# Patient Record
Sex: Female | Born: 1960 | Race: White | Hispanic: No
Health system: Southern US, Community
[De-identification: ages and names within clinical notes are randomized; demographics above are authoritative.]

---

## 2004-06-16 ENCOUNTER — Observation Stay (HOSPITAL_COMMUNITY): Admission: RE | Admit: 2004-06-16 | Discharge: 2004-06-17 | Payer: Self-pay | Admitting: Gynecology

## 2004-06-18 ENCOUNTER — Emergency Department: Payer: Self-pay | Admitting: Emergency Medicine

## 2004-06-19 ENCOUNTER — Ambulatory Visit: Payer: Self-pay | Admitting: Emergency Medicine

## 2005-12-25 ENCOUNTER — Emergency Department: Payer: Self-pay | Admitting: Emergency Medicine

## 2006-10-15 ENCOUNTER — Ambulatory Visit: Payer: Self-pay | Admitting: Gastroenterology

## 2006-12-12 ENCOUNTER — Ambulatory Visit: Payer: Self-pay | Admitting: Pain Medicine

## 2006-12-23 ENCOUNTER — Ambulatory Visit: Payer: Self-pay | Admitting: Pain Medicine

## 2007-01-21 ENCOUNTER — Ambulatory Visit: Payer: Self-pay | Admitting: Pain Medicine

## 2007-06-17 ENCOUNTER — Ambulatory Visit: Payer: Self-pay | Admitting: Pain Medicine

## 2007-07-16 ENCOUNTER — Ambulatory Visit: Payer: Self-pay | Admitting: Pain Medicine

## 2007-08-11 ENCOUNTER — Emergency Department: Payer: Self-pay | Admitting: Emergency Medicine

## 2007-08-11 ENCOUNTER — Ambulatory Visit: Payer: Self-pay | Admitting: Internal Medicine

## 2007-11-23 ENCOUNTER — Ambulatory Visit: Payer: Self-pay | Admitting: Family Medicine

## 2009-03-06 ENCOUNTER — Ambulatory Visit: Payer: Self-pay | Admitting: Family Medicine

## 2009-06-10 ENCOUNTER — Emergency Department: Payer: Self-pay | Admitting: Emergency Medicine

## 2010-03-26 ENCOUNTER — Ambulatory Visit: Payer: Self-pay | Admitting: Family Medicine

## 2010-04-09 ENCOUNTER — Emergency Department: Payer: Self-pay | Admitting: Emergency Medicine

## 2010-04-10 ENCOUNTER — Inpatient Hospital Stay: Payer: Self-pay | Admitting: Urology

## 2010-04-13 ENCOUNTER — Ambulatory Visit: Payer: Self-pay | Admitting: Urology

## 2010-04-28 ENCOUNTER — Ambulatory Visit: Payer: Self-pay | Admitting: Urology

## 2011-01-13 ENCOUNTER — Emergency Department: Payer: Self-pay | Admitting: Emergency Medicine

## 2011-07-05 ENCOUNTER — Emergency Department: Payer: Self-pay | Admitting: Emergency Medicine

## 2012-02-20 ENCOUNTER — Ambulatory Visit: Payer: Self-pay | Admitting: Family Medicine

## 2013-06-18 ENCOUNTER — Emergency Department: Payer: Self-pay | Admitting: Emergency Medicine

## 2013-06-18 LAB — BASIC METABOLIC PANEL
Anion Gap: 6 — ABNORMAL LOW (ref 7–16)
BUN: 8 mg/dL (ref 7–18)
Co2: 26 mmol/L (ref 21–32)
EGFR (African American): >60
Osmolality: 272 (ref 275–301)

## 2013-06-18 LAB — CBC
HCT: 37.8 % (ref 35.0–47.0)
HGB: 13.3 g/dL (ref 12.0–16.0)
MCHC: 35.2 g/dL (ref 32.0–36.0)
MCV: 86 fL (ref 80–100)
RDW: 12.2 % (ref 11.5–14.5)
WBC: 7 10*3/uL (ref 3.6–11.0)

## 2013-06-18 LAB — TROPONIN I: Troponin-I: <0.02 ng/mL

## 2015-01-05 ENCOUNTER — Emergency Department
Admit: 2015-01-05 | Disposition: A | Discharge status: Home / Self Care | Payer: Self-pay | Admitting: Emergency Medicine

## 2015-01-05 LAB — COMPREHENSIVE METABOLIC PANEL
ALBUMIN: 4.2 g/dL
Alkaline Phosphatase: 77 U/L
Anion Gap: 9 (ref 7–16)
BUN: 10 mg/dL
Bilirubin,Total: 0.6 mg/dL
CALCIUM: 9.7 mg/dL
CO2: 29 mmol/L
Chloride: 101 mmol/L
Creatinine: 0.64 mg/dL
EGFR (African American): >60
EGFR (Non-African Amer.): >60
Glucose: 88 mg/dL
Potassium: 3.9 mmol/L
SGOT(AST): 23 U/L
SGPT (ALT): 12 U/L — ABNORMAL LOW
Sodium: 139 mmol/L
Total Protein: 8.1 g/dL

## 2015-01-05 LAB — LIPASE, BLOOD: Lipase: 33 U/L

## 2015-01-05 LAB — CBC WITH DIFFERENTIAL/PLATELET
BASOS ABS: 0.1 10*3/uL (ref 0.0–0.1)
Basophil %: 0.9 %
EOS ABS: 0.1 10*3/uL (ref 0.0–0.7)
EOS PCT: 1.9 %
HCT: 39.1 % (ref 35.0–47.0)
HGB: 13 g/dL (ref 12.0–16.0)
Lymphocyte #: 1.6 10*3/uL (ref 1.0–3.6)
Lymphocyte %: 27 %
MCH: 29.7 pg (ref 26.0–34.0)
MCHC: 33.3 g/dL (ref 32.0–36.0)
MCV: 89 fL (ref 80–100)
MONO ABS: 0.3 x10 3/mm (ref 0.2–0.9)
Monocyte %: 5.1 %
Neutrophil #: 4 10*3/uL (ref 1.4–6.5)
Neutrophil %: 65.1 %
Platelet: 238 10*3/uL (ref 150–440)
RBC: 4.38 10*6/uL (ref 3.80–5.20)
RDW: 12.9 % (ref 11.5–14.5)
WBC: 6.1 10*3/uL (ref 3.6–11.0)

## 2015-01-05 LAB — TROPONIN I

## 2015-01-13 ENCOUNTER — Other Ambulatory Visit: Payer: Self-pay

## 2015-01-13 ENCOUNTER — Ambulatory Visit: Admit: 2015-01-13 | Disposition: A | Discharge status: Home / Self Care | Payer: Self-pay | Admitting: Gastroenterology

## 2015-02-08 LAB — SURGICAL PATHOLOGY

## 2019-07-10 ENCOUNTER — Telehealth: Payer: Self-pay

## 2019-07-10 NOTE — Telephone Encounter (Signed)
Pre-visit screening call attempted prior to [facility] appointment on 07/14/2019. No answer / left msg.

## 2019-07-14 ENCOUNTER — Ambulatory Visit: Payer: Self-pay | Attending: Oncology | Admitting: *Deleted

## 2019-07-14 ENCOUNTER — Other Ambulatory Visit: Payer: Self-pay

## 2019-07-14 ENCOUNTER — Encounter: Payer: Self-pay | Admitting: *Deleted

## 2019-07-14 ENCOUNTER — Ambulatory Visit
Admission: RE | Admit: 2019-07-14 | Discharge: 2019-07-14 | Disposition: A | Discharge status: Home / Self Care | Payer: Self-pay | Source: Ambulatory Visit | Attending: Oncology | Admitting: Oncology

## 2019-07-14 VITALS — BP 107/75 | HR 63 | Temp 96.7°F | Ht 61.0 in | Wt 160.4 lb

## 2019-07-14 DIAGNOSIS — Z0000 Encounter for general adult medical examination without abnormal findings: Secondary | ICD-10-CM

## 2019-07-14 NOTE — Progress Notes (Signed)
Risk Assessment    No risk assessment data for the current encounter   Risk Scores      07/13/2019   Last edited by: Orson Slick, CMA   5-year risk: 2.1 %   Lifetime risk: 13.3 %

## 2019-07-14 NOTE — Progress Notes (Signed)
  Subjective:     Patient ID: Nancy Baker, female   DOB: 24-May-1961, 58 y.o.   MRN: [id number]  HPI   Review of Systems     Objective:   Physical Exam Chest:     Breasts:        Right: No swelling, bleeding, inverted nipple, mass, nipple discharge, skin change or tenderness.        Left: No swelling, bleeding, inverted nipple, mass, nipple discharge, skin change or tenderness.    Lymphadenopathy:     Upper Body:     Right upper body: No supraclavicular or axillary adenopathy.     Left upper body: No supraclavicular or axillary adenopathy.        Assessment:     58 year old White female self referral to [facility] for clinical breast exam and mammogram.  Clinical breast exam reveals scarring noted from bilateral breast reduction.  There is no dominant mass, skin changes, nipple discharge or lymphadenopathy.  Family history of her mother with ovarian cancer and cervical cancer at age 48, and breast cancer at age 22.  Mom did not have genetic testing.  Patient states she now has dementia.  Tyrer-Cuzick breast cancer risk assessment with a lifetime risk of13.4%.  Per NCCN guidelines no further imagaing or genetic testing is recommended.  She does meet NCCN guidelines for genetic testing.  Discussed with patient option to refer to medical oncology high risk breast clinic and she states she does not want to have genetic testing.  States she will get her annual mammogram.  Pap smear omitted per protocol.  Patient has a history of hysterectomy for heavy bleeding.  Last pap in 2015 at [facility] was negative.  Patient has been screened for eligibility.  She does not have any insurance, Medicare or Medicaid.  She also meets financial eligibility.  Hand-out given on the Affordable Care Act.    Plan:     Screening mammogram ordered.  Will follow up per [facility] protocol.

## 2019-07-14 NOTE — Patient Instructions (Signed)
Gave patient hand-out, Women Staying Healthy, Active and Well from [facility], with education on breast health, pap smears, heart and colon health. 

## 2019-07-16 ENCOUNTER — Encounter: Payer: Self-pay | Admitting: *Deleted

## 2019-07-22 ENCOUNTER — Encounter: Payer: Self-pay | Admitting: *Deleted

## 2021-05-23 ENCOUNTER — Other Ambulatory Visit: Payer: Self-pay | Admitting: Family Medicine

## 2021-05-23 ENCOUNTER — Ambulatory Visit
Admission: RE | Admit: 2021-05-23 | Discharge: 2021-05-23 | Disposition: A | Discharge status: Home / Self Care | Payer: Self-pay | Source: Ambulatory Visit | Attending: Family Medicine | Admitting: Family Medicine

## 2021-05-23 ENCOUNTER — Ambulatory Visit
Admission: RE | Admit: 2021-05-23 | Discharge: 2021-05-23 | Disposition: A | Discharge status: Home / Self Care | Payer: Self-pay | Attending: Family Medicine | Admitting: Family Medicine

## 2021-05-23 ENCOUNTER — Other Ambulatory Visit: Payer: Self-pay

## 2021-05-23 DIAGNOSIS — R52 Pain, unspecified: Secondary | ICD-10-CM

## 2022-06-11 IMAGING — CR DG HAND COMPLETE 3+V*L*
3 series · 3 of 3 positions shown · non-contrast
Comparison: None.

CLINICAL DATA: Fall with pain. Left hand and wrist pain after fall
over the weekend.

EXAM:
LEFT HAND - COMPLETE 3+ VIEW

[hand ap]
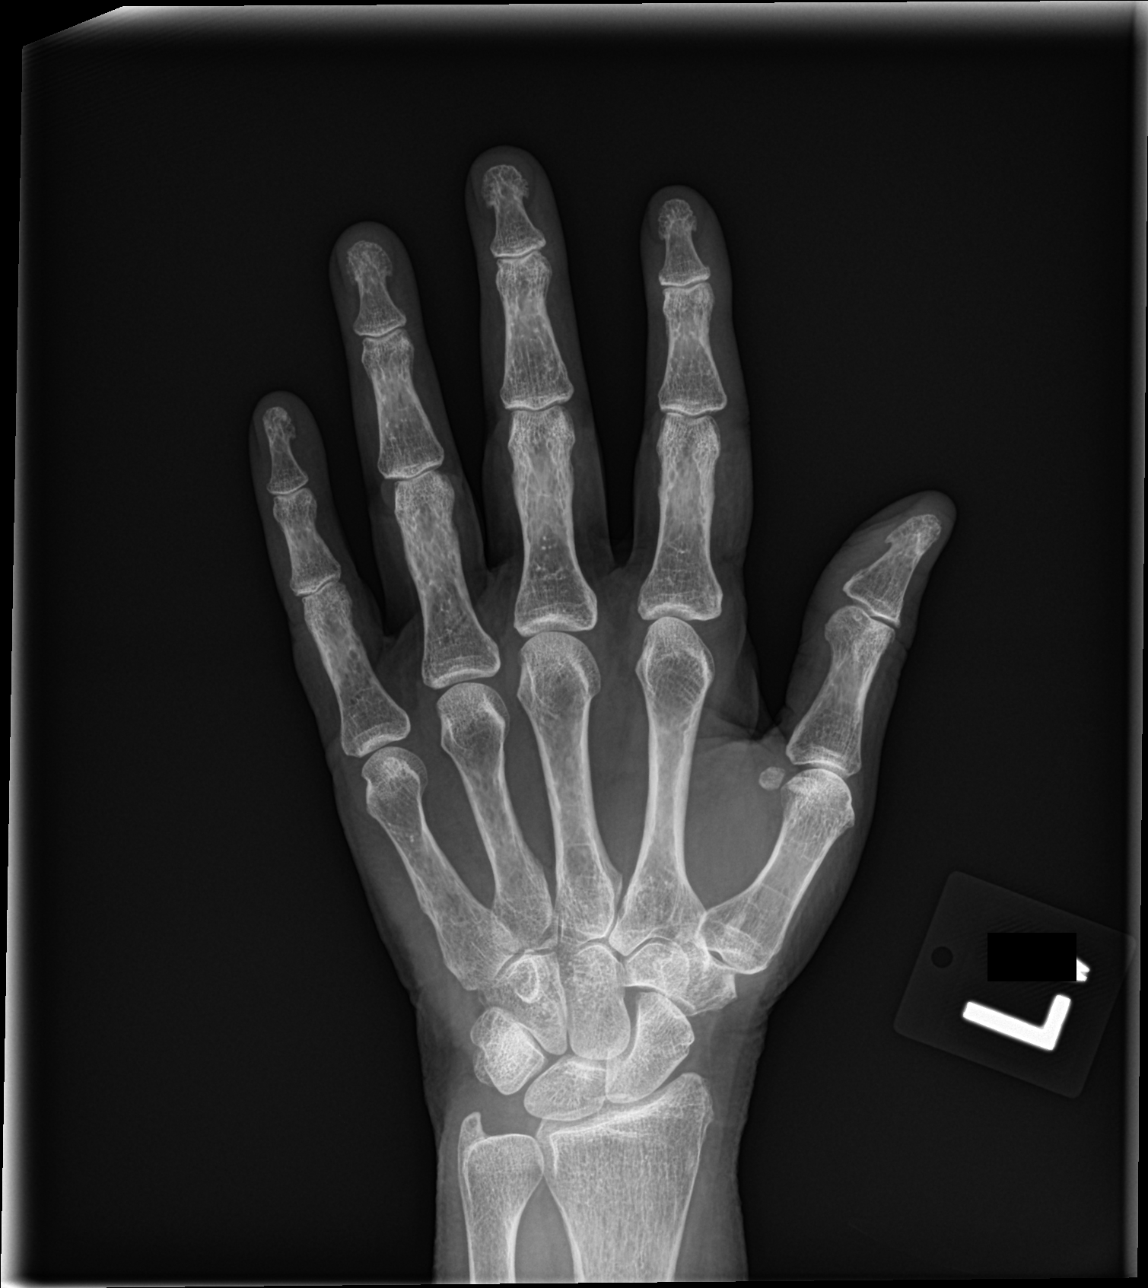

[hand obl]
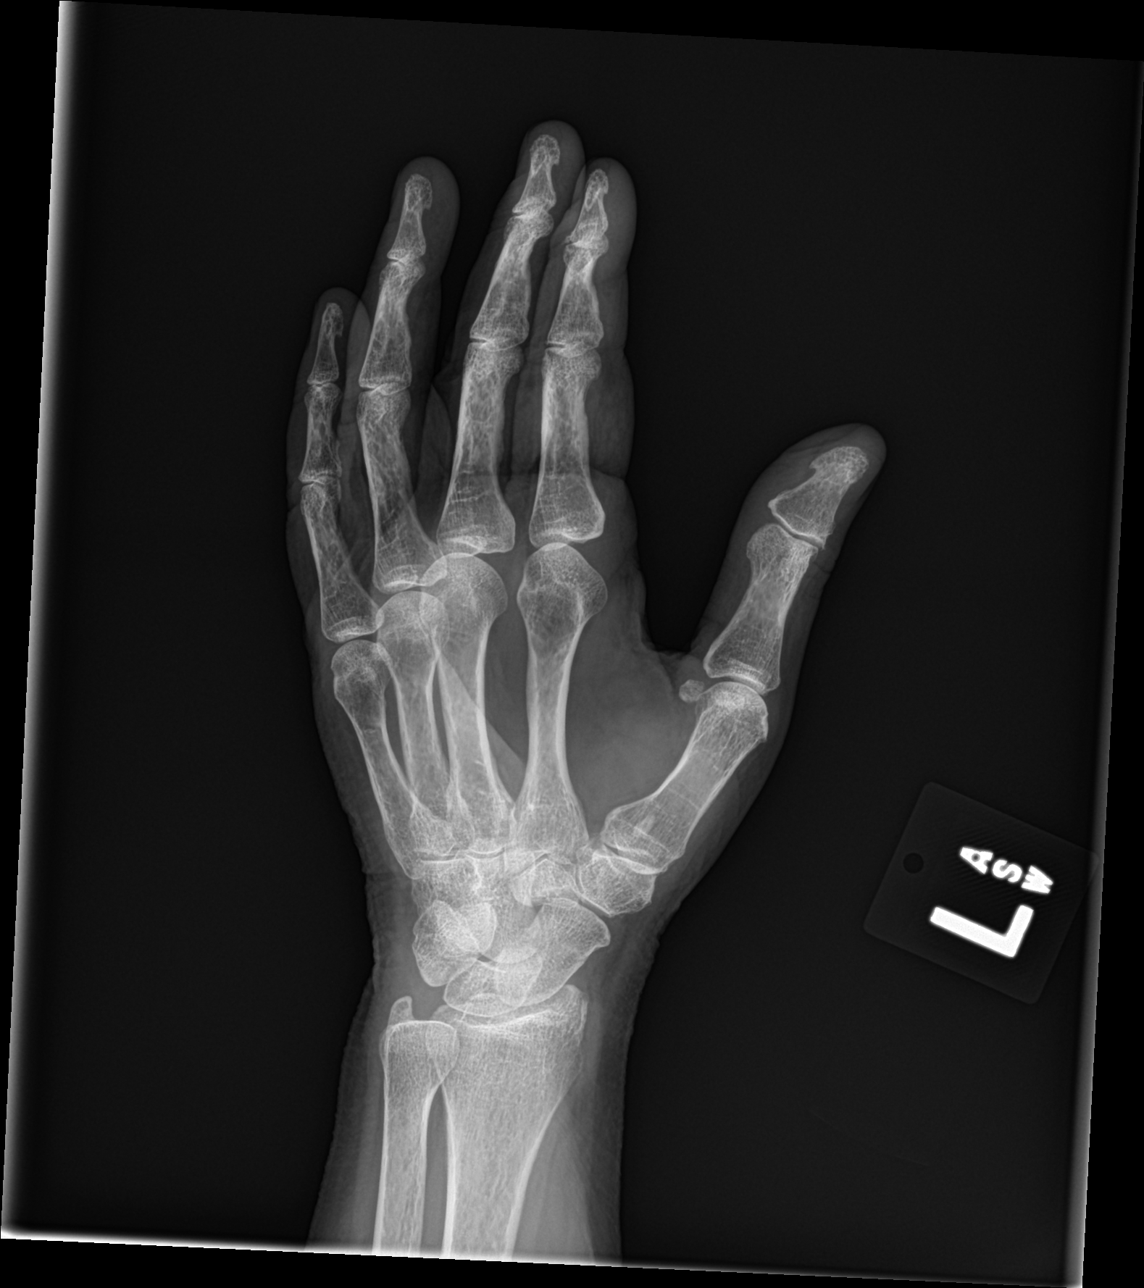

[hand lat]
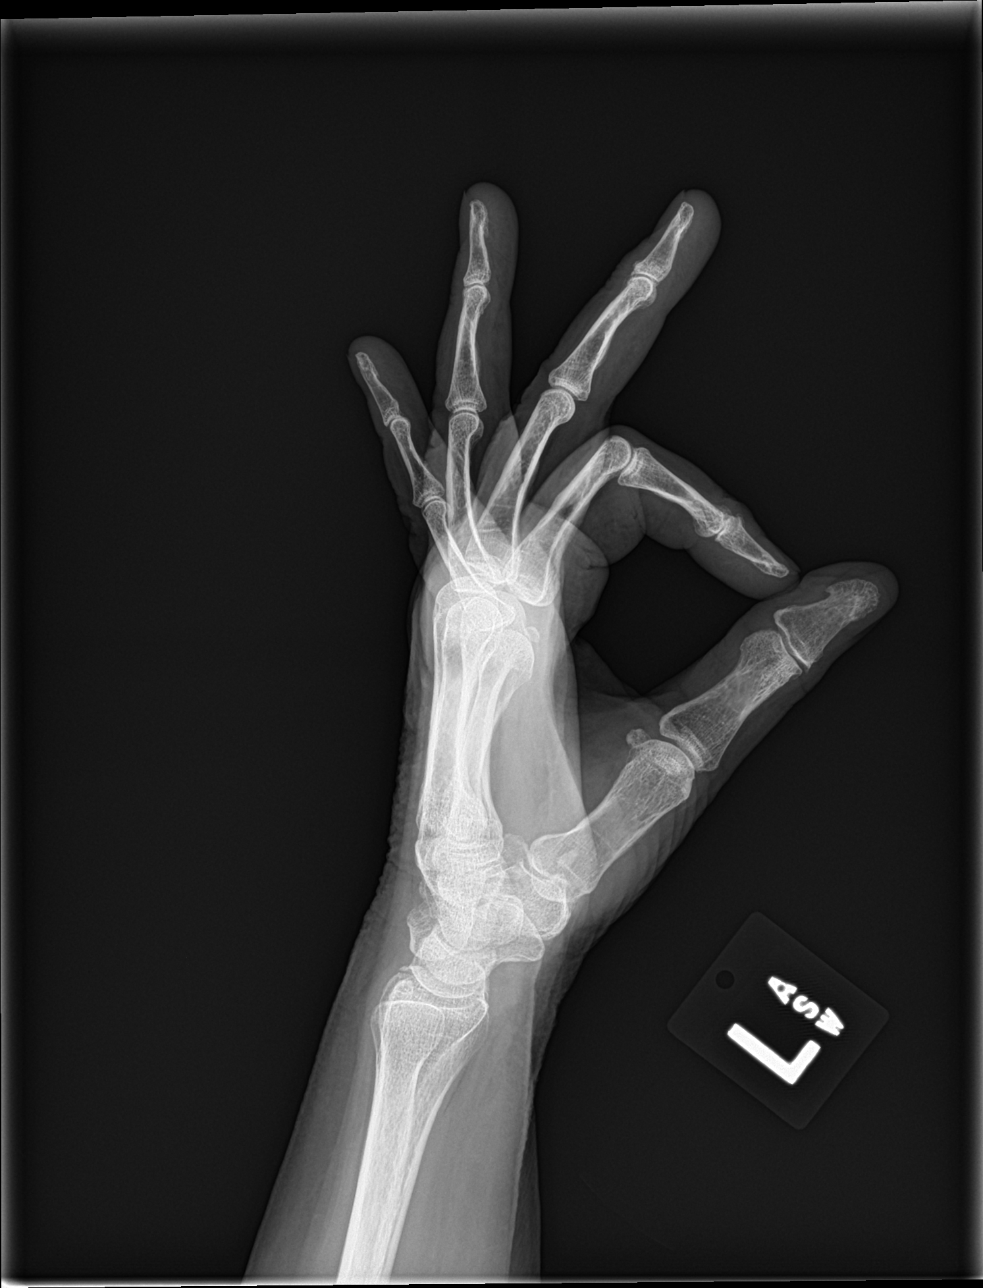

[3 of 3 positions shown; findings below may reference images not displayed]

FINDINGS: There is no evidence of fracture or dislocation. There is no
evidence of arthropathy or other focal bone abnormality. Soft
tissues are unremarkable.
IMPRESSION: No fracture or dislocation of the left hand.

## 2022-06-11 IMAGING — CR DG WRIST COMPLETE 3+V*L*
4 series · 4 of 4 positions shown · non-contrast
Comparison: None.

CLINICAL DATA: Pain after fall. Left hand and wrist pain after fall
over the weekend.

EXAM:
LEFT WRIST - COMPLETE 3+ VIEW

[wrist pa]
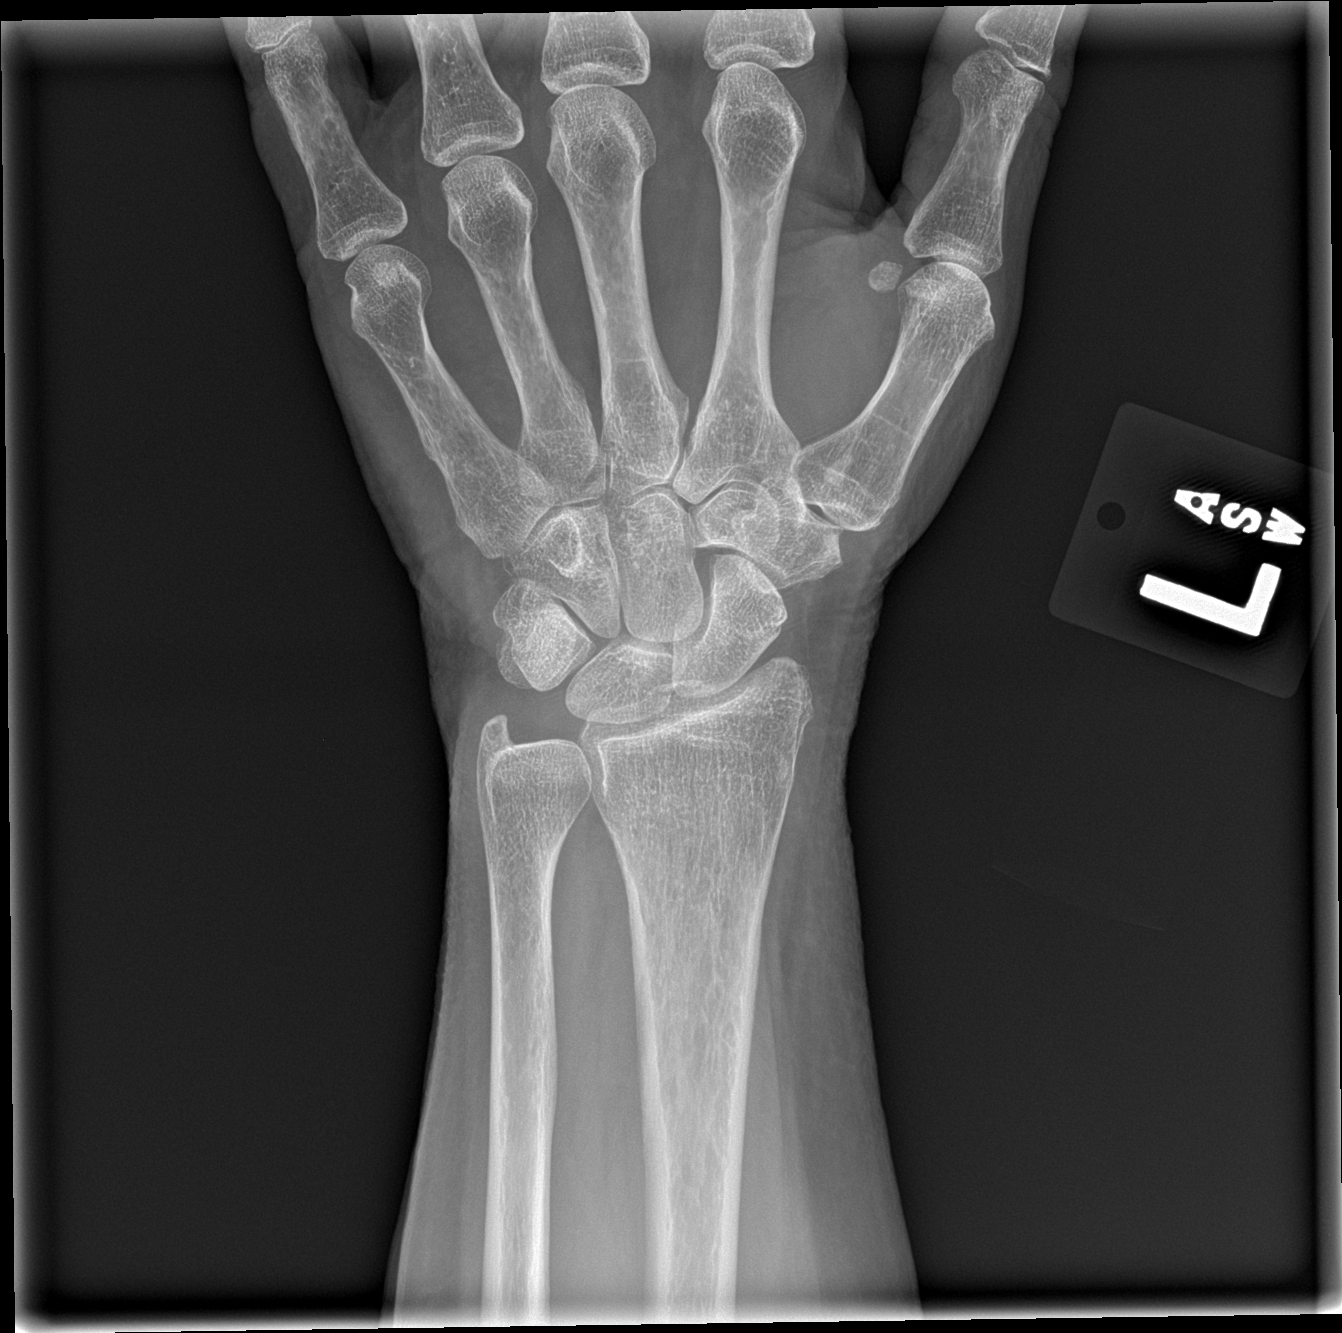

[wrist obl]
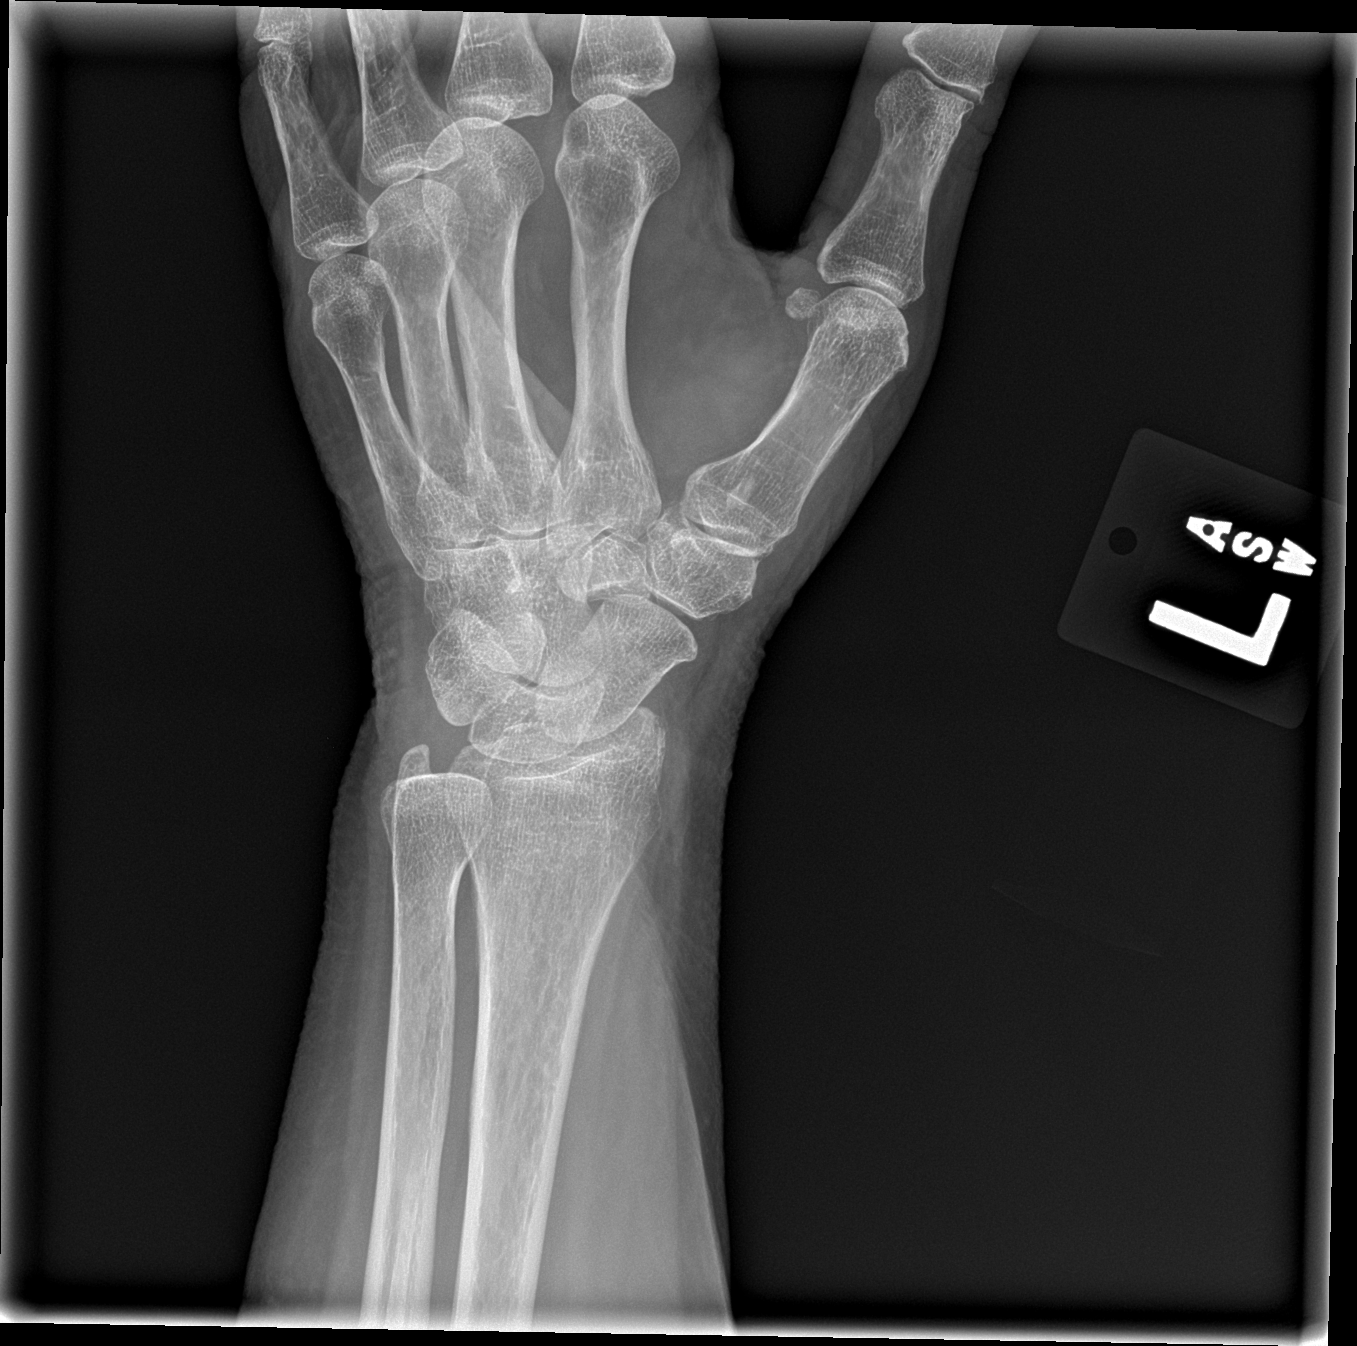

[wrist lat]
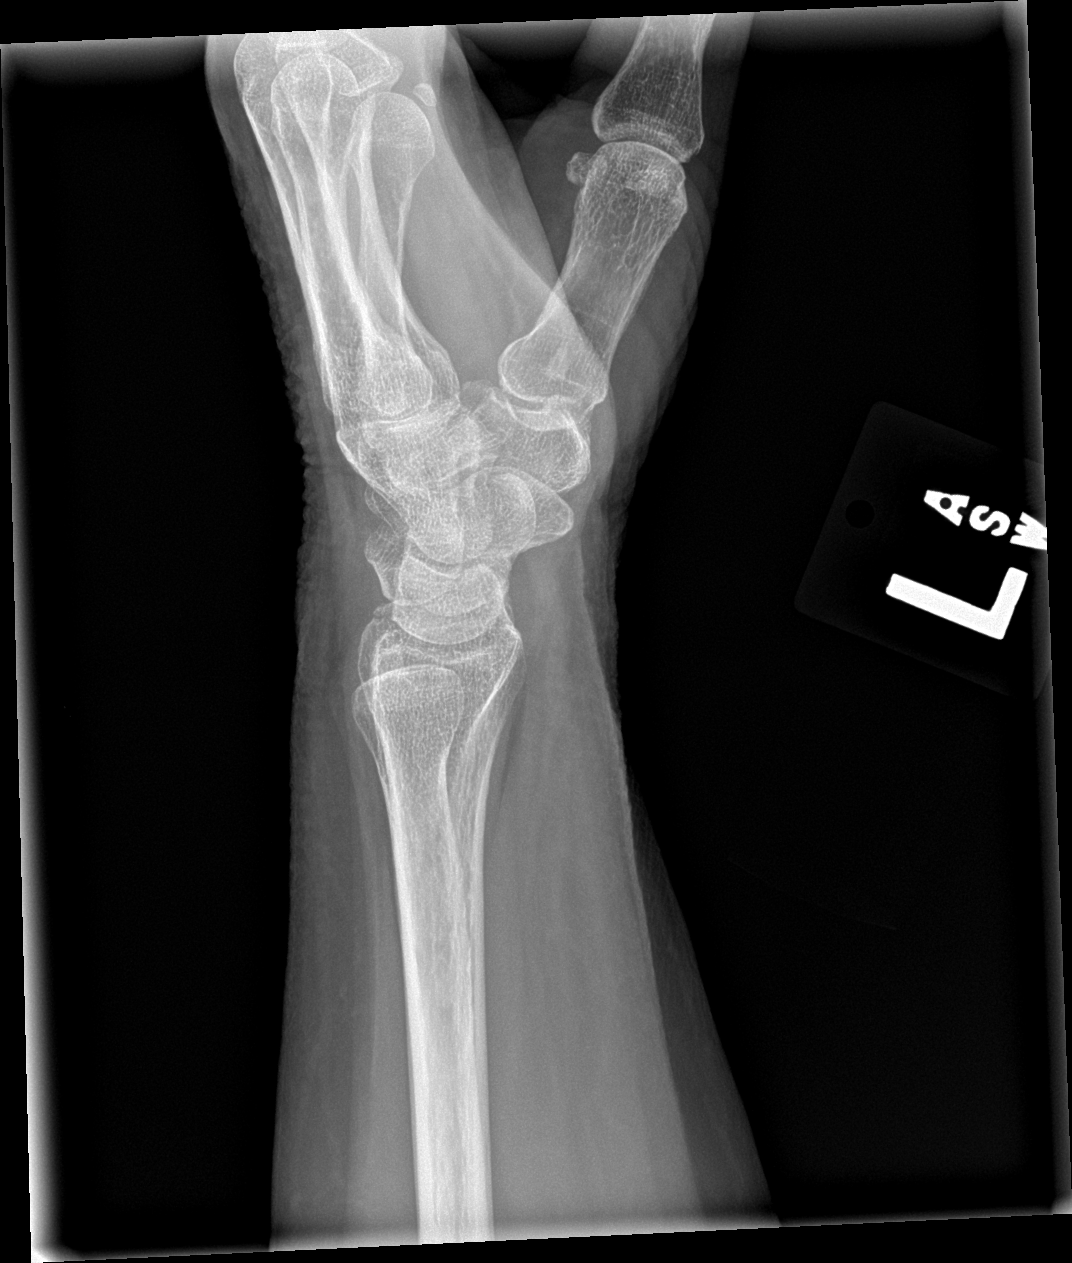

[wrist navicular]
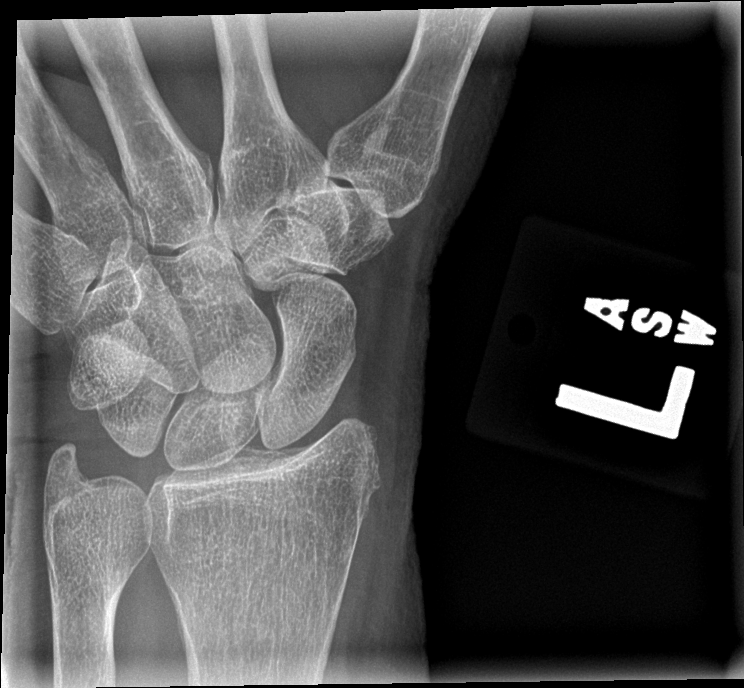

[4 of 4 positions shown; findings below may reference images not displayed]

FINDINGS: Subtle nondisplaced fracture involving the dorsal cortex of the
distal radius. No additional fracture of the wrist. Carpal bones are
intact. Distal ulna is intact. Normal alignment. Minimal soft tissue
edema.
IMPRESSION: Subtle nondisplaced fracture involving the dorsal cortex of the
distal radius.

## 2024-07-31 ENCOUNTER — Other Ambulatory Visit: Payer: Self-pay | Admitting: Family Medicine

## 2024-07-31 DIAGNOSIS — Z1231 Encounter for screening mammogram for malignant neoplasm of breast: Secondary | ICD-10-CM
